# Patient Record
Sex: Male | Born: 1965 | Race: White | Hispanic: No | Marital: Married | Smoking: Never smoker
Health system: Southern US, Community
[De-identification: ages and names within clinical notes are randomized; demographics above are authoritative.]

## PROBLEM LIST (undated history)

## (undated) DIAGNOSIS — L570 Actinic keratosis: Secondary | ICD-10-CM

## (undated) DIAGNOSIS — J3089 Other allergic rhinitis: Secondary | ICD-10-CM

## (undated) HISTORY — DX: Actinic keratosis: L57.0

## (undated) HISTORY — DX: Other allergic rhinitis: J30.89

---

## 2004-07-06 HISTORY — PX: VASECTOMY: SHX75

## 2014-06-12 ENCOUNTER — Ambulatory Visit: Payer: Self-pay | Admitting: Family Medicine

## 2015-08-03 ENCOUNTER — Ambulatory Visit (INDEPENDENT_AMBULATORY_CARE_PROVIDER_SITE_OTHER): Payer: Federal, State, Local not specified - PPO | Admitting: Family Medicine

## 2015-08-03 ENCOUNTER — Encounter: Payer: Self-pay | Admitting: Family Medicine

## 2015-08-03 VITALS — BP 104/70 | HR 80 | Temp 97.7°F | Resp 16 | Ht 69.5 in | Wt 202.8 lb

## 2015-08-03 DIAGNOSIS — Z13220 Encounter for screening for lipoid disorders: Secondary | ICD-10-CM

## 2015-08-03 DIAGNOSIS — Z0000 Encounter for general adult medical examination without abnormal findings: Secondary | ICD-10-CM

## 2015-08-03 DIAGNOSIS — Z131 Encounter for screening for diabetes mellitus: Secondary | ICD-10-CM

## 2015-08-03 NOTE — Progress Notes (Signed)
Subjective:     Patient ID: Stephen Hubbard, male   DOB: 07/03/1965, 50 y.o.   MRN: [id number]  HPI  Chief Complaint  Patient presents with  . Annual Exam    Patient comes in office today for his annual physical, he states that he has no questions today or concerns. Patient reports that he has noticed visual changes over the past year.   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   Review of Systems General: Feeling well HEENT: regular dental visits. Wears otc reading glasses with last eye exam 1.5 years ago Cardiovascular: no chest pain, shortness of breath, or palpitations GI: rare heartburn, no change in bowel habits or blood in the stool GU: nocturia x 0- 1, no change in bladder habits  Psychiatric: not depressed Musculoskeletal: no joint pain    Objective:   Physical Exam  Constitutional: He appears well-developed and well-nourished. No distress.  Eyes: PERRLA Neck: no thyromegaly, tenderness or nodules,  ENT: TM's intact without inflammation; No tonsillar enlargement or exudate, Lungs: Clear Heart : RRR without murmur or gallop Abd: bowel sounds present, soft, non-tender, no organomegaly Extremities: no edema     Assessment:    1. Annual physical exam  2. Screening for cholesterol level - Lipid panel  3. Screening for diabetes mellitus - Comprehensive metabolic panel    Plan:    Further f/u pending lab work.

## 2015-08-03 NOTE — Patient Instructions (Signed)
We will call you with the lab result.

## 2015-08-07 LAB — COMPREHENSIVE METABOLIC PANEL WITH GFR
ALT: 17 IU/L (ref 0–44)
AST: 17 IU/L (ref 0–40)
Albumin/Globulin Ratio: 1.8 (ref 1.1–2.5)
Albumin: 4.2 g/dL (ref 3.5–5.5)
Alkaline Phosphatase: 69 IU/L (ref 39–117)
BUN/Creatinine Ratio: 15 (ref 9–20)
BUN: 13 mg/dL (ref 6–24)
Bilirubin Total: 1 mg/dL (ref 0.0–1.2)
CO2: 27 mmol/L (ref 18–29)
Calcium: 9.3 mg/dL (ref 8.7–10.2)
Chloride: 104 mmol/L (ref 96–106)
Creatinine, Ser: 0.85 mg/dL (ref 0.76–1.27)
GFR calc Af Amer: 119 mL/min/1.73
GFR calc non Af Amer: 103 mL/min/1.73
Globulin, Total: 2.4 g/dL (ref 1.5–4.5)
Glucose: 102 mg/dL — ABNORMAL HIGH (ref 65–99)
Potassium: 5.2 mmol/L (ref 3.5–5.2)
Sodium: 143 mmol/L (ref 134–144)
Total Protein: 6.6 g/dL (ref 6.0–8.5)

## 2015-08-07 LAB — LIPID PANEL
CHOL/HDL RATIO: 3.7 ratio (ref 0.0–5.0)
Cholesterol, Total: 151 mg/dL (ref 100–199)
HDL: 41 mg/dL (ref >39)
LDL CALC: 99 mg/dL (ref 0–99)
TRIGLYCERIDES: 54 mg/dL (ref 0–149)
VLDL CHOLESTEROL CAL: 11 mg/dL (ref 5–40)

## 2015-08-09 ENCOUNTER — Telehealth: Payer: Self-pay

## 2015-08-09 NOTE — Telephone Encounter (Signed)
Patient has been advised, KW 

## 2015-08-09 NOTE — Telephone Encounter (Signed)
-----   Message from Carmon Ginsberg, [place] sent at 08/09/2015  7:40 AM EST ----- Labs look good except for a very mild elevation of your sugar. Would check your labs annually.

## 2016-04-14 ENCOUNTER — Ambulatory Visit (INDEPENDENT_AMBULATORY_CARE_PROVIDER_SITE_OTHER): Payer: Federal, State, Local not specified - PPO | Admitting: Family Medicine

## 2016-04-14 ENCOUNTER — Encounter: Payer: Self-pay | Admitting: Family Medicine

## 2016-04-14 VITALS — BP 130/80 | HR 84 | Temp 97.9°F | Resp 16 | Wt 200.0 lb

## 2016-04-14 DIAGNOSIS — M26629 Arthralgia of temporomandibular joint, unspecified side: Secondary | ICD-10-CM

## 2016-04-14 MED ORDER — CYCLOBENZAPRINE HCL 5 MG PO TABS: 5.0000 mg | ORAL_TABLET | Freq: Every day | ORAL | 0 refills | Status: DC

## 2016-04-14 NOTE — Progress Notes (Signed)
Subjective:     Patient ID: Stephen Hubbard, male   DOB: [birth year], 50 y.o.   MRN: [id number]  HPI  Chief Complaint  Patient presents with  . Jaw Pain    x 1 week. Started with jaw "popping", and now pt can barely eat due to pain. Right side of jaw. Radiating into right ear. Exacerbated by chewing.  States he does not chew gum but does like to eat nuts and apples throughout his workday. Denies bruxism or prior joint problems. States he is up to date with his dentist.   Review of Systems     Objective:   Physical Exam  Constitutional: He appears well-developed and well-nourished. No distress.  HENT:  Right Ear: Tympanic membrane and ear canal normal.  No tooth tenderness on percussion. Right buccal area without inflammation or lesion.  Musculoskeletal:  Mild tenderness and crepitus to his left TMJ joint with movement.       Assessment:    1. TMJ syndrome - cyclobenzaprine (FLEXERIL) 5 MG tablet; Take 1 tablet (5 mg total) by mouth at bedtime.  Dispense: 14 tablet; Refill: 0    Plan:    Discussed switching to soft foods and scheduling ibuprofen 800 mg. 3 x day with food. Consider ENT evaluation if not improving.

## 2016-04-14 NOTE — Patient Instructions (Signed)
Discussed taking ibuprofen 800 mg. 3 x day with food. Avoid hard food. May wish to use soft foods for a few days. If not improving over a week call for ENT referral.

## 2016-08-03 ENCOUNTER — Ambulatory Visit (INDEPENDENT_AMBULATORY_CARE_PROVIDER_SITE_OTHER): Payer: Federal, State, Local not specified - PPO | Admitting: Family Medicine

## 2016-08-03 ENCOUNTER — Encounter: Payer: Self-pay | Admitting: Family Medicine

## 2016-08-03 VITALS — BP 122/84 | HR 86 | Temp 98.2°F | Resp 16 | Ht 68.5 in | Wt 194.4 lb

## 2016-08-03 DIAGNOSIS — Z23 Encounter for immunization: Secondary | ICD-10-CM | POA: Diagnosis not present

## 2016-08-03 DIAGNOSIS — Z0000 Encounter for general adult medical examination without abnormal findings: Secondary | ICD-10-CM | POA: Diagnosis not present

## 2016-08-03 NOTE — Patient Instructions (Signed)
We will call you with the lab work. 

## 2016-08-03 NOTE — Progress Notes (Signed)
Subjective:     Patient ID: Stephen Hubbard, male   DOB: [birth year], 51 y.o.   MRN: [id number]  HPI  Chief Complaint  Patient presents with  . Annual Exam    Patient comes in office today for his annual physical, he states that he is feeling well today. Patient states that he follows a well balanced diet and is staying active but not actively exercising, patient averages 6-7hrs of sleep a night. Patients last reported TD was 2004 he is due for Tdap vaccine, he has declined flu vaccine today.   States he has a dermatology visit pending for a small left temporal lesion. He is bald and reports significant sun exposure. Also is scheduling with podiatry for a knot on his foot. He continues to work for the [employer].   Review of Systems General: Feeling well HEENT: regular dental visits and recent eye exam with glasses prescribed. Cardiovascular: no chest pain, shortness of breath, or palpitations GI: no heartburn, no change in bowel habits. Rarely will find blood on toilet tissue if he has been straining or doing heavy work. GU: nocturia x 0, no change in bladder habits  Psychiatric: not depressed Musculoskeletal: no joint pain    Objective:   Physical Exam  Constitutional: He appears well-developed and well-nourished. No distress.  Eyes: PERRLA Neck: no thyromegaly, tenderness or nodules, no cervical adenopathy ENT: TM's intact without inflammation; No tonsillar enlargement or exudate, Lungs: Clear Heart : RRR without murmur or gallop Abd: bowel sounds present, soft, non-tender, no organomegaly Extremities: no edema, right foot: dorsal aspect of third metatarsal with 1 cm area of induration c/w ganglion cyst. Skin: 3-4 mm tan nevus on left temporal area.     Assessment:    1. Need for tetanus booster - Tdap vaccine greater than or equal to 7yo IM  2. Annual physical exam - Comprehensive metabolic panel - Lipid panel    Plan:    Further f/u pending labs and consultant  evaluations.

## 2016-08-08 DIAGNOSIS — Z0000 Encounter for general adult medical examination without abnormal findings: Secondary | ICD-10-CM | POA: Diagnosis not present

## 2016-08-09 ENCOUNTER — Telehealth: Payer: Self-pay

## 2016-08-09 LAB — LIPID PANEL
Chol/HDL Ratio: 3.4 ratio (ref 0.0–5.0)
Cholesterol, Total: 158 mg/dL (ref 100–199)
HDL: 46 mg/dL
LDL Calculated: 102 mg/dL — ABNORMAL HIGH (ref 0–99)
Triglycerides: 51 mg/dL (ref 0–149)
VLDL Cholesterol Cal: 10 mg/dL (ref 5–40)

## 2016-08-09 LAB — COMPREHENSIVE METABOLIC PANEL
ALBUMIN: 4.3 g/dL (ref 3.5–5.5)
ALK PHOS: 70 IU/L (ref 39–117)
ALT: 20 IU/L (ref 0–44)
AST: 21 IU/L (ref 0–40)
Albumin/Globulin Ratio: 1.7 (ref 1.2–2.2)
BUN / CREAT RATIO: 20 (ref 9–20)
BUN: 16 mg/dL (ref 6–24)
Bilirubin Total: 0.9 mg/dL (ref 0.0–1.2)
CHLORIDE: 100 mmol/L (ref 96–106)
CO2: 26 mmol/L (ref 18–29)
CREATININE: 0.81 mg/dL (ref 0.76–1.27)
Calcium: 9.2 mg/dL (ref 8.7–10.2)
GFR calc Af Amer: 121 mL/min/{1.73_m2} (ref >59)
GFR calc non Af Amer: 104 mL/min/{1.73_m2} (ref >59)
GLUCOSE: 96 mg/dL (ref 65–99)
Globulin, Total: 2.5 g/dL (ref 1.5–4.5)
Potassium: 4.5 mmol/L (ref 3.5–5.2)
Sodium: 141 mmol/L (ref 134–144)
TOTAL PROTEIN: 6.8 g/dL (ref 6.0–8.5)

## 2016-08-09 NOTE — Telephone Encounter (Signed)
-----   Message from Carmon Ginsberg, [place] sent at 08/09/2016  7:45 AM EST ----- Labs are ok. Minimally elevated cholesterol with a low calculated 10 year risk of developing cardiovascular disease of 2.2%.

## 2016-08-09 NOTE — Telephone Encounter (Signed)
Patient has been advised. KW 

## 2016-09-21 DIAGNOSIS — K080 Exfoliation of teeth due to systemic causes: Secondary | ICD-10-CM | POA: Diagnosis not present

## 2016-09-26 DIAGNOSIS — M7751 Other enthesopathy of right foot: Secondary | ICD-10-CM | POA: Diagnosis not present

## 2016-09-26 DIAGNOSIS — M79671 Pain in right foot: Secondary | ICD-10-CM | POA: Diagnosis not present

## 2016-11-01 DIAGNOSIS — M79671 Pain in right foot: Secondary | ICD-10-CM | POA: Diagnosis not present

## 2016-11-01 DIAGNOSIS — M7751 Other enthesopathy of right foot: Secondary | ICD-10-CM | POA: Diagnosis not present

## 2016-11-06 DIAGNOSIS — L821 Other seborrheic keratosis: Secondary | ICD-10-CM | POA: Diagnosis not present

## 2016-11-06 DIAGNOSIS — L812 Freckles: Secondary | ICD-10-CM | POA: Diagnosis not present

## 2016-11-06 DIAGNOSIS — D485 Neoplasm of uncertain behavior of skin: Secondary | ICD-10-CM | POA: Diagnosis not present

## 2016-11-06 DIAGNOSIS — L578 Other skin changes due to chronic exposure to nonionizing radiation: Secondary | ICD-10-CM | POA: Diagnosis not present

## 2016-11-06 DIAGNOSIS — D1800 Hemangioma unspecified site: Secondary | ICD-10-CM | POA: Diagnosis not present

## 2016-11-06 DIAGNOSIS — L820 Inflamed seborrheic keratosis: Secondary | ICD-10-CM | POA: Diagnosis not present

## 2017-01-25 ENCOUNTER — Encounter: Payer: Self-pay | Admitting: Physician Assistant

## 2017-01-25 ENCOUNTER — Ambulatory Visit (INDEPENDENT_AMBULATORY_CARE_PROVIDER_SITE_OTHER): Payer: Federal, State, Local not specified - PPO | Admitting: Physician Assistant

## 2017-01-25 VITALS — BP 116/78 | HR 84 | Temp 98.7°F | Resp 16 | Wt 187.0 lb

## 2017-01-25 DIAGNOSIS — G6289 Other specified polyneuropathies: Secondary | ICD-10-CM | POA: Diagnosis not present

## 2017-01-25 DIAGNOSIS — L299 Pruritus, unspecified: Secondary | ICD-10-CM | POA: Diagnosis not present

## 2017-01-25 DIAGNOSIS — R21 Rash and other nonspecific skin eruption: Secondary | ICD-10-CM | POA: Diagnosis not present

## 2017-01-25 LAB — POCT GLYCOSYLATED HEMOGLOBIN (HGB A1C): Hemoglobin A1C: 5.2

## 2017-01-25 MED ORDER — HYDROXYZINE HCL 25 MG PO TABS: 25.0000 mg | ORAL_TABLET | Freq: Three times a day (TID) | ORAL | 0 refills | Status: DC | PRN

## 2017-01-25 MED ORDER — TRIAMCINOLONE ACETONIDE 0.1 % EX CREA: 1.0000 "application " | TOPICAL_CREAM | Freq: Two times a day (BID) | CUTANEOUS | 0 refills | Status: DC

## 2017-01-25 NOTE — Patient Instructions (Signed)
Peripheral Neuropathy Peripheral neuropathy is a type of nerve damage. It affects nerves that carry signals between the spinal cord and other parts of the body. These are called peripheral nerves. With peripheral neuropathy, one nerve or a group of nerves may be damaged. What are the causes? Many things can damage peripheral nerves. For some people with peripheral neuropathy, the cause is unknown. Some causes include:  Diabetes. This is the most common cause of peripheral neuropathy.  Injury to a nerve.  Pressure or stress on a nerve that lasts a long time.  Too little vitamin B. Alcoholism can lead to this.  Infections.  Autoimmune diseases, such as multiple sclerosis and systemic lupus erythematosus.  Inherited nerve diseases.  Some medicines, such as cancer drugs.  Toxic substances, such as lead and mercury.  Too little blood flowing to the legs.  Kidney disease.  Thyroid disease.  What are the signs or symptoms? Different people have different symptoms. The symptoms you have will depend on which of your nerves is damaged. Common symptoms include:  Loss of feeling (numbness) in the feet and hands.  Tingling in the feet and hands.  Pain that burns.  Very sensitive skin.  Weakness.  Not being able to move a part of the body (paralysis).  Muscle twitching.  Clumsiness or poor coordination.  Loss of balance.  Not being able to control your bladder.  Feeling dizzy.  Sexual problems.  How is this diagnosed? Peripheral neuropathy is a symptom, not a disease. Finding the cause of peripheral neuropathy can be hard. To figure that out, your health care provider will take a medical history and do a physical exam. A neurological exam will also be done. This involves checking things affected by your brain, spinal cord, and nerves (nervous system). For example, your health care provider will check your reflexes, how you move, and what you can feel. Other types of tests  may also be ordered, such as:  Blood tests.  A test of the fluid in your spinal cord.  Imaging tests, such as CT scans or an MRI.  Electromyography (EMG). This test checks the nerves that control muscles.  Nerve conduction velocity tests. These tests check how fast messages pass through your nerves.  Nerve biopsy. A small piece of nerve is removed. It is then checked under a microscope.  How is this treated?  Medicine is often used to treat peripheral neuropathy. Medicines may include: ? Pain-relieving medicines. Prescription or over-the-counter medicine may be suggested. ? Antiseizure medicine. This may be used for pain. ? Antidepressants. These also may help ease pain from neuropathy. ? Lidocaine. This is a numbing medicine. You might wear a patch or be given a shot. ? Mexiletine. This medicine is typically used to help control irregular heart rhythms.  Surgery. Surgery may be needed to relieve pressure on a nerve or to destroy a nerve that is causing pain.  Physical therapy to help movement.  Assistive devices to help movement. Follow these instructions at home:  Only take over-the-counter or prescription medicines as directed by your health care provider. Follow the instructions carefully for any given medicines. Do not take any other medicines without first getting approval from your health care provider.  If you have diabetes, work closely with your health care provider to keep your blood sugar under control.  If you have numbness in your feet: ? Check every day for signs of injury or infection. Watch for redness, warmth, and swelling. ? Wear padded socks and comfortable   shoes. These help protect your feet.  Do not do things that put pressure on your damaged nerve.  Do not smoke. Smoking keeps blood from getting to damaged nerves.  Avoid or limit alcohol. Too much alcohol can cause a lack of B vitamins. These vitamins are needed for healthy nerves.  Develop a good  support system. Coping with peripheral neuropathy can be stressful. Talk to a mental health specialist or join a support group if you are struggling.  Follow up with your health care provider as directed. Contact a health care provider if:  You have new signs or symptoms of peripheral neuropathy.  You are struggling emotionally from dealing with peripheral neuropathy.  You have a fever. Get help right away if:  You have an injury or infection that is not healing.  You feel very dizzy or begin vomiting.  You have chest pain.  You have trouble breathing. This information is not intended to replace advice given to you by your health care provider. Make sure you discuss any questions you have with your health care provider. Document Released: 05/12/2002 Document Revised: 10/28/2015 Document Reviewed: 01/27/2013 Elsevier Interactive Patient Education  2017 Elsevier Inc.  

## 2017-01-25 NOTE — Progress Notes (Signed)
Patient: Stephen Hubbard Male    DOB: 10/29/1965   51 y.o.   MRN: [id number] Visit Date: 01/25/2017  Today's Provider: Trinna Post, PA-C   Chief Complaint  Patient presents with  . Rash    Comes and goes for the last six weeks.   Subjective:      Stephen Hubbard is a 51 y/o who presents with both itching and a rash ongoing for six weeks. The itching and rash are unrelated. First he has a rash on his back that comes and goes, especially when he was working on cars and laying on gravel. No tick exposure that he remembers.   Second, he has episodes of intense burning, itching in his hands and his feet, mostly at night, but no rash ever appears here. He says it happens at night. He has changed his detergent to unscented, gentle detergents. Has noticed no difference from this. No new lotions or scents. Does not use alcohol. Fatigued, but no history of thyroid dysfunction. No history of anemia. No problems with tongue being red and burning.   He does mention that he has been undergoing stress recently as his daughter has moved back home after being homeless and addicted to drugs, she is also pregnant.   Rash  This is a recurrent problem. The current episode started more than 1 month ago. The problem has been waxing and waning since onset. The rash is diffuse (Comes and goes in different spots. ). The rash is characterized by itchiness. He was exposed to nothing. Associated symptoms include fatigue. Pertinent negatives include no fever.    No Known Allergies   Current Outpatient Prescriptions:  Marland Kitchen  Multiple Vitamin (MULTIVITAMIN) tablet, Take 1 tablet by mouth daily., Disp: , Rfl:   Review of Systems  Constitutional: Positive for fatigue. Negative for activity change, appetite change, chills, diaphoresis, fever and unexpected weight change.  Gastrointestinal: Negative.   Skin: Positive for rash. Negative for color change, pallor and wound.       Pt also reports "Itching" hands,  feet, and arms.   Neurological: Negative for dizziness, light-headedness and headaches.    Social History  Substance Use Topics  . Smoking status: Never Smoker  . Smokeless tobacco: Never Used  . Alcohol use No   Objective:   BP 116/78 (BP Location: Left Arm, Patient Position: Sitting, Cuff Size: Normal)   Pulse 84   Temp 98.7 F (37.1 C) (Oral)   Resp 16   Wt 187 lb (84.8 kg)   BMI 28.02 kg/m  Vitals:   01/25/17 0825  BP: 116/78  Pulse: 84  Resp: 16  Temp: 98.7 F (37.1 C)  TempSrc: Oral  Weight: 187 lb (84.8 kg)     Physical Exam  Constitutional: He is oriented to person, place, and time. He appears well-developed and well-nourished.  Musculoskeletal: Normal range of motion.  Neurological: He is alert and oriented to person, place, and time. He has normal strength. No sensory deficit.  Skin: Skin is warm and dry. Rash noted.  Some faint erythematous papules on right side of back along T4-T8.  Psychiatric: He has a normal mood and affect. His behavior is normal.        Assessment & Plan:     1. Other polyneuropathy  A1C today is 5.2%. Will check for anemia and TSH disorders. Liver and kidney function normal from 08/2016. Exam benign.   - POCT HgB A1C - CBC With Differential - B12 -  TSH  2. Itching  - hydrOXYzine (ATARAX/VISTARIL) 25 MG tablet; Take 1 tablet (25 mg total) by mouth 3 (three) times daily as needed for itching.  Dispense: 30 tablet; Refill: 0  3. Rash  Think this rash is unrelated to itching and more of a contact dermatitis.   - triamcinolone cream (KENALOG) 0.1 %; Apply 1 application topically 2 (two) times daily.  Dispense: 30 g; Refill: 0  No Follow-up on file.  The entirety of the information documented in the History of Present Illness, Review of Systems and Physical Exam were personally obtained by me. Portions of this information were initially documented by Ashley Royalty, CMA and reviewed by me for thoroughness and accuracy.           Trinna Post, PA-C  [facility]

## 2017-01-26 LAB — CBC WITH DIFFERENTIAL
Basophils Absolute: 0 10*3/uL (ref 0.0–0.2)
Basos: 1 %
EOS (ABSOLUTE): 0.1 10*3/uL (ref 0.0–0.4)
Eos: 2 %
Hematocrit: 47.4 % (ref 37.5–51.0)
Hemoglobin: 16.3 g/dL (ref 13.0–17.7)
Immature Grans (Abs): 0 10*3/uL (ref 0.0–0.1)
Immature Granulocytes: 0 %
Lymphocytes Absolute: 2.1 10*3/uL (ref 0.7–3.1)
Lymphs: 37 %
MCH: 30.7 pg (ref 26.6–33.0)
MCHC: 34.4 g/dL (ref 31.5–35.7)
MCV: 89 fL (ref 79–97)
Monocytes Absolute: 0.5 10*3/uL (ref 0.1–0.9)
Monocytes: 10 %
Neutrophils Absolute: 2.9 10*3/uL (ref 1.4–7.0)
Neutrophils: 50 %
RBC: 5.31 x10E6/uL (ref 4.14–5.80)
RDW: 12.9 % (ref 12.3–15.4)
WBC: 5.7 10*3/uL (ref 3.4–10.8)

## 2017-01-26 LAB — VITAMIN B12: Vitamin B-12: 451 pg/mL (ref 232–1245)

## 2017-01-26 LAB — TSH: TSH: 1.18 u[IU]/mL (ref 0.450–4.500)

## 2017-06-06 ENCOUNTER — Encounter: Payer: Self-pay | Admitting: Family Medicine

## 2017-06-06 ENCOUNTER — Other Ambulatory Visit: Payer: Self-pay

## 2017-06-06 ENCOUNTER — Ambulatory Visit: Payer: Federal, State, Local not specified - PPO | Admitting: Family Medicine

## 2017-06-06 VITALS — BP 112/84 | HR 82 | Temp 97.7°F | Resp 16 | Wt 191.0 lb

## 2017-06-06 DIAGNOSIS — L299 Pruritus, unspecified: Secondary | ICD-10-CM

## 2017-06-06 MED ORDER — HYDROXYZINE HCL 25 MG PO TABS: 25.0000 mg | ORAL_TABLET | Freq: Three times a day (TID) | ORAL | 1 refills | Status: DC | PRN

## 2017-06-06 NOTE — Progress Notes (Signed)
Patient: Stephen Hubbard Male    DOB: [birth year]   52 y.o.   MRN: [id number] Visit Date: 06/06/2017  Today's Provider: Lavon Paganini, MD   Chief Complaint  Patient presents with  . Anxiety   I, Emily Ratchford, CMA, am acting as scribe for Lavon Paganini, MD.  Subjective:    Anxiety  Presents for initial visit. Episode onset: since December 6th, when grandchild was born. The problem has been gradually worsening. Symptoms include insomnia and nervous/anxious behavior. Patient reports no chest pain, confusion, decreased concentration, depressed mood, dizziness, dry mouth, excessive worry, feeling of choking, hyperventilation, irritability, malaise, palpitations, panic, restlessness or suicidal ideas. Primary symptoms comment: "I'm wore out". The symptoms are aggravated by family issues (states he is struggling with his grandchild, who is 72.78 weeks old. His grandchild's father is causing trouble in the family.  He states social services has been involved.).    Pt is mainly concerned about pruritis. Denies rash. States the itching could be caused by stress. Has tried Aveeno, without relief. Triamcinolone cream is also ineffective. He has tried Hydroxyzine, with some relief (was better when taking 25mg  dose, but was cutting them trying to not run out because he didn't have refills).  Has been seen previously in our clinic and thought to have hives and treated with Hydroxyzine.  He states that he never itches while at work and focused on other things.  States that he is worse when relaxing in the evenings and mind wanders.  Denies changes in soaps, detergents, meds, etc.     GAD 7 : Generalized Anxiety Score 06/06/2017  Nervous, Anxious, on Edge 0  Control/stop worrying 0  Worry too much - different things 1  Trouble relaxing 1  Restless 1  Easily annoyed or irritable 0  Afraid - awful might happen 2  Total GAD 7 Score 5  Anxiety Difficulty Not difficult at all      No Known  Allergies   Current Outpatient Medications:  Marland Kitchen  Multiple Vitamin (MULTIVITAMIN) tablet, Take 1 tablet by mouth daily., Disp: , Rfl:  .  hydrOXYzine (ATARAX/VISTARIL) 25 MG tablet, Take 1 tablet (25 mg total) by mouth 3 (three) times daily as needed for itching. (Patient not taking: Reported on 06/06/2017), Disp: 30 tablet, Rfl: 0 .  triamcinolone cream (KENALOG) 0.1 %, Apply 1 application topically 2 (two) times daily. (Patient not taking: Reported on 06/06/2017), Disp: 30 g, Rfl: 0  Review of Systems  Constitutional: Negative for irritability.  Cardiovascular: Negative for chest pain and palpitations.  Neurological: Negative for dizziness.  Psychiatric/Behavioral: Negative for confusion, decreased concentration and suicidal ideas. The patient is nervous/anxious and has insomnia.     Social History   Tobacco Use  . Smoking status: Never Smoker  . Smokeless tobacco: Never Used  Substance Use Topics  . Alcohol use: No    Alcohol/week: 0.0 oz   Objective:   BP 112/84 (BP Location: Left Arm, Patient Position: Sitting, Cuff Size: Normal)   Pulse 82   Temp 97.7 F (36.5 C) (Oral)   Resp 16   Wt 191 lb (86.6 kg)   SpO2 95%   BMI 28.62 kg/m  Vitals:   06/06/17 1542  BP: 112/84  Pulse: 82  Resp: 16  Temp: 97.7 F (36.5 C)  TempSrc: Oral  SpO2: 95%  Weight: 191 lb (86.6 kg)     Physical Exam  Constitutional: He is oriented to person, place, and time. He appears well-developed and well-nourished.  No distress.  HENT:  Head: Normocephalic and atraumatic.  Eyes: Conjunctivae are normal. No scleral icterus.  Cardiovascular: Normal rate, regular rhythm, normal heart sounds and intact distal pulses.  No murmur heard. Pulmonary/Chest: Effort normal and breath sounds normal. No respiratory distress. He has no wheezes. He has no rales.  Musculoskeletal: He exhibits no edema.  Neurological: He is alert and oriented to person, place, and time. No cranial nerve deficit.  Skin: Skin is  warm and dry. No rash noted.  Few excoriations on b/l hands  Psychiatric: He has a normal mood and affect. His behavior is normal.  Vitals reviewed.       Assessment & Plan:     1. 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  Dispense: 90 tablet; Refill: 1 - return prn - precautions discussed     The entirety of the information documented in the History of Present Illness, Review of Systems and Physical Exam were personally obtained by me. Portions of this information were initially documented by Raquel Sarna Ratchford, CMA and reviewed by me for thoroughness and accuracy.    Virginia Crews, MD, MPH [facility] 06/06/2017 4:24 PM

## 2017-06-06 NOTE — Patient Instructions (Signed)
Neurodermatitis Neurodermatitis is inflammation and thickening of the skin. It is caused by severe itchiness, which leads to repeated scratching and rubbing of the skin. It can happen anywhere on the body. Common places include the neck, head, arms, and legs. Neurodermatitis may have mental or emotional (psychogenic) causes that must be treated. Usually, this is a lifelong (chronic) condition, but in some cases it may go away on its own or with treatment. What are the causes? This condition is caused by repeated scratching and rubbing of the skin. This happens because of feelings of severe itchiness. Often, the cause of itchiness is not known. Common causes include:  Materials or substances that irritate the skin.  Skin conditions, such as chronic dermatitis or eczema.  Allergic reaction.  In some cases, neurodermatitis may have psychogenic causes, such as anxiety or stress. What increases the risk? This condition is more likely to develop in:  People who have dry skin or other skin conditions.  People who have anxiety disorders or high amounts of stress.  People who are 20-50 years of age.  Women.  People who are exposed to irritating chemicals.  People who spend time in hot places or sweat a lot.  What are the signs or symptoms? The main symptom of this condition is one or more patches of skin that are red, swollen, itchy, and thicker than normal skin. These patches can be anywhere on the body, but they are often found on the head, neck, legs, and arms. Neurodermatitis can also affect the genital and anal areas. In severe cases, bleeding, crusting, and scaling of the skin can occur. Symptoms often come and go over time. The frequency and severity of symptoms varies from person to person. How is this diagnosed? This condition is diagnosed based on your medical history and a physical exam of your skin. You may be referred to a health care provider who specializes in skin care  (dermatologist). You may also have tests, including:  Skin allergy tests. These tests will determine if you have an allergy that is causing your condition.  Tests for certain infections, hemorrhoids, psoriasis, or other conditions. These tests may be done if your groin or anal area is inflamed.  In some cases, your health care provider may remove a small amount of skin cells to be examined under a microscope (biopsy). How is this treated? This condition is managed by stopping all scratching and rubbing of the affected area. It is also important to remove all skin irritants and treat any underlying causes of neurodermatitis. Depending on the cause, your health care provider may recommend certain medicines, such as:  Creams, lotions, or pills to reduce inflammation and itching (corticosteroids).  Medicines to prevent or treat infection (antibiotics).  Medicines to relieve allergy symptoms (antihistamines).  Therapy to learn how to stop feelings of itchiness and stop scratching (behavioral therapy or psychotherapy) may be a treatment option. Your health care provider may recommend a doctor who specializes in human behavior (psychologist). Follow these instructions at home: Skin Care  Avoid scratching and rubbing your skin. This is the best way to manage neurodermatitis and prevent it from returning.  Keep your skin clean and moisturized. ? Avoid very hot water. ? Apply lotion at least one time per day. ? Avoid products, such as soaps and lotions, that have harsh chemicals, scents, and dyes. ? Try to shower and take baths only as often as you need to. Frequent bathing can dry out your skin.  Drink enough fluid to   keep your urine clear or pale yellow.  Always wear sunscreen. General instructions  Use over-the-counter and prescription medicines only as told by your health care provider.  Avoid tight or rough clothing that irritates your skin.  Avoid irritating chemicals.  Pay  attention to your symptoms. Watch for things that trigger itching and scratching.  Keep all follow-up visits as told by your health care provider. This is important. Contact a health care provider if:  Your condition gets worse or does not get better after 3-4 days of treatment.  You have blood or fluid leaking from an irritated patch of skin.  You have a fever. This information is not intended to replace advice given to you by your health care provider. Make sure you discuss any questions you have with your health care provider. Document Released: 06/29/2004 Document Revised: 10/28/2015 Document Reviewed: 10/07/2014 Elsevier Interactive Patient Education  2018 [employer] Inc.  

## 2017-06-12 DIAGNOSIS — M2021 Hallux rigidus, right foot: Secondary | ICD-10-CM | POA: Diagnosis not present

## 2017-06-12 DIAGNOSIS — G5761 Lesion of plantar nerve, right lower limb: Secondary | ICD-10-CM | POA: Diagnosis not present

## 2017-06-12 DIAGNOSIS — M79671 Pain in right foot: Secondary | ICD-10-CM | POA: Diagnosis not present

## 2017-07-23 ENCOUNTER — Encounter: Payer: Self-pay | Admitting: Family Medicine

## 2017-07-23 ENCOUNTER — Ambulatory Visit: Payer: Federal, State, Local not specified - PPO | Admitting: Family Medicine

## 2017-07-23 VITALS — BP 110/80 | HR 99 | Temp 100.2°F | Wt 200.0 lb

## 2017-07-23 DIAGNOSIS — R509 Fever, unspecified: Secondary | ICD-10-CM | POA: Diagnosis not present

## 2017-07-23 DIAGNOSIS — J101 Influenza due to other identified influenza virus with other respiratory manifestations: Secondary | ICD-10-CM | POA: Diagnosis not present

## 2017-07-23 DIAGNOSIS — R52 Pain, unspecified: Secondary | ICD-10-CM | POA: Diagnosis not present

## 2017-07-23 LAB — POCT INFLUENZA A/B
Influenza A, POC: POSITIVE — AB
Influenza B, POC: NEGATIVE

## 2017-07-23 MED ORDER — OSELTAMIVIR PHOSPHATE 75 MG PO CAPS: 75.0000 mg | ORAL_CAPSULE | Freq: Two times a day (BID) | ORAL | 0 refills | Status: DC

## 2017-07-23 NOTE — Progress Notes (Signed)
      Patient: Stephen Hubbard Male    DOB: 02-Mar-1966   52 y.o.   MRN: [id number] Visit Date: 07/23/2017  Today's Provider: Vernie Murders, PA   Chief Complaint  Patient presents with  . Sore Throat  . Fever   Subjective:    URI   This is a new problem. Episode onset: Saturday. The problem has been waxing and waning. The maximum temperature recorded prior to his arrival was 100.4 - 100.9 F. The fever has been present for 1 to 2 days. Associated symptoms include a sore throat. Treatments tried: throat drops, gargle salt water. The treatment provided mild relief.   Past Medical History:  Diagnosis Date  . 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 VASECTOMY  07/2004   Family History  Problem Relation Age of Onset  . Cancer Mother        breast  . Drug abuse Father   . Heart attack Father   . Depression Brother    No Known Allergies  Current Outpatient Medications:  .  hydrOXYzine (ATARAX/VISTARIL) 25 MG tablet, Take 1-2 tablets (25-50 mg total) by mouth every 8 (eight) hours as needed for itching., Disp: 90 tablet, Rfl: 1 .  Multiple Vitamin (MULTIVITAMIN) tablet, Take 1 tablet by mouth daily., Disp: , Rfl:   Review of Systems  Constitutional: Positive for fever.  HENT: Positive for sore throat.   Respiratory: Negative.   Cardiovascular: Negative.   Musculoskeletal: Positive for myalgias.   Social History   Tobacco Use  . Smoking status: Never Smoker  . Smokeless tobacco: Never Used  Substance Use Topics  . Alcohol use: No    Alcohol/week: 0.0 oz   Objective:   BP 110/80 (BP Location: Right Arm, Patient Position: Sitting, Cuff Size: Normal)   Pulse 99   Temp 100.2 F (37.9 C) (Oral)   Wt 200 lb (90.7 kg)   SpO2 98%   BMI 29.97 kg/m   Physical Exam  Constitutional: He is oriented to person, place, and time. He appears well-developed and well-nourished. No distress.  HENT:  Head: Normocephalic and atraumatic.    Right Ear: Hearing normal.  Left Ear: Hearing normal.  Nose: Nose normal.  Eyes: Conjunctivae and lids are normal. Right eye exhibits no discharge. Left eye exhibits no discharge. No scleral icterus.  Neck: Neck supple.  Cardiovascular: Normal rate and regular rhythm.  Pulmonary/Chest: Effort normal and breath sounds normal. No respiratory distress.  Abdominal: Soft. Bowel sounds are normal.  Musculoskeletal: Normal range of motion.  Neurological: He is alert and oriented to person, place, and time.  Skin: Skin is intact. No lesion and no rash noted.  Psychiatric: He has a normal mood and affect. His speech is normal and behavior is normal. Thought content normal.       Assessment & Plan:     1. Influenza A Onset 3 days ago. Having sore throat with fever and mild headache. Using Tylenol and saltwater gargles prn. Increase fluid intake and treat with Tamiflu. Recheck prn. - oseltamivir (TAMIFLU) 75 MG capsule; Take 1 capsule (75 mg total) by mouth 2 (two) times daily.  Dispense: 10 capsule; Refill: [place], PA  [facility]

## 2017-07-23 NOTE — Patient Instructions (Signed)

## 2017-07-23 NOTE — Addendum Note (Signed)
Addended by: Jules Schick on: 07/23/2017 09:29 AM   Modules accepted: Orders

## 2017-08-15 ENCOUNTER — Ambulatory Visit: Payer: Federal, State, Local not specified - PPO | Admitting: Family Medicine

## 2017-08-15 ENCOUNTER — Encounter: Payer: Self-pay | Admitting: Family Medicine

## 2017-08-15 VITALS — BP 106/70 | HR 88 | Temp 97.8°F | Resp 16

## 2017-08-15 DIAGNOSIS — K529 Noninfective gastroenteritis and colitis, unspecified: Secondary | ICD-10-CM

## 2017-08-15 NOTE — Progress Notes (Signed)
Subjective:     Patient ID: Stephen Hubbard, male   DOB: Feb 06, 1966, 52 y.o.   MRN: [id number] Chief Complaint  Patient presents with  . Diarrhea    Started Sunday evening with abdominal pain, vomit (once) then diarrhea started Monday afternoon.     HPI Reports frequent watery stools which have started abating with time and imodium: "I was able to sleep last night." Symptoms started after eating at a restaurant. No other family members are ill.  Review of Systems     Objective:   Physical Exam  Constitutional: He appears well-developed and well-nourished. No distress.  Abdominal: Bowel sounds are normal. There is no tenderness. There is no guarding.       Assessment:    1. Gastroenteritis, acute: spontaneously resolving     Plan:    Continue imodium and eat as tolerated. Work excuse for 3/12-3/14/19.

## 2017-08-15 NOTE — Patient Instructions (Signed)
Continue imodium and start to eat as tolerated. Continue with Gatorade.

## 2017-12-27 DIAGNOSIS — K080 Exfoliation of teeth due to systemic causes: Secondary | ICD-10-CM | POA: Diagnosis not present

## 2018-02-21 ENCOUNTER — Encounter: Payer: Self-pay | Admitting: Family Medicine

## 2018-02-21 ENCOUNTER — Ambulatory Visit: Payer: Federal, State, Local not specified - PPO | Admitting: Family Medicine

## 2018-02-21 VITALS — BP 114/80 | HR 66 | Temp 97.9°F | Resp 16 | Wt 194.0 lb

## 2018-02-21 DIAGNOSIS — K219 Gastro-esophageal reflux disease without esophagitis: Secondary | ICD-10-CM

## 2018-02-21 DIAGNOSIS — L299 Pruritus, unspecified: Secondary | ICD-10-CM

## 2018-02-21 MED ORDER — HYDROXYZINE HCL 25 MG PO TABS: ORAL_TABLET | ORAL | 1 refills | Status: AC

## 2018-02-21 NOTE — Patient Instructions (Signed)
Decrease caffeine use at night. Elevate the head of your bed on 6 inch blocks. Let me know if your acid reflux is not improving.

## 2018-02-21 NOTE — Progress Notes (Signed)
  Subjective:     Patient ID: Stephen Hubbard, male   DOB: 11/29/1965, 52 y.o.   MRN: [id number] Chief Complaint  Patient presents with  . Pruritis    Patient comes in office today with concerns of itching for the past several months. Patient states that he has been itching on his hands, feet and upper torso. Patient reports that he was previously put on hydroxyzine and it has helped some with symptoms. Patient states that he has been under more stress as of recent working 65+hrs a week.  . Gastroesophageal Reflux    Patient reports for the past month regurgiating at night when laying down. Patient states that when he lays on his stomach or side he regurgitates.    HPI States his itching improves with the use of one hydroxyzine daily. Estimates he uses this 3 x week. 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 States he consumes 4-6+ cups of coffee daily-4 in the evening. He will get withdrawal headaches if he misses the coffee. States he does not use a pillow in bed. Discussed caffeine may be contributing to reflux.  Review of Systems     Objective:   Physical Exam  Constitutional: He appears well-developed and well-nourished. No distress.  Abdominal: Soft. There is no tenderness.       Assessment:    1. Gastroesophageal reflux disease without esophagitis: HOB elevation and slowly decrease nighttime caffeine use.  2. Itching - hydrOXYzine (ATARAX/VISTARIL) 25 MG tablet; Take one tablet 3 x day as needed for itching  Dispense: 90 tablet; Refill: 1    Plan:    Further f/u if not improving. Defers flu vaccine today. Will schedule for a physical in the near future.

## 2018-04-19 ENCOUNTER — Encounter: Payer: Self-pay | Admitting: Family Medicine

## 2018-04-22 ENCOUNTER — Encounter: Payer: Self-pay | Admitting: Family Medicine

## 2018-04-22 ENCOUNTER — Ambulatory Visit: Payer: Federal, State, Local not specified - PPO | Admitting: Family Medicine

## 2018-04-22 ENCOUNTER — Ambulatory Visit: Payer: Self-pay | Admitting: Family Medicine

## 2018-04-22 VITALS — BP 116/84 | HR 88 | Temp 97.8°F | Resp 16 | Wt 198.4 lb

## 2018-04-22 DIAGNOSIS — F321 Major depressive disorder, single episode, moderate: Secondary | ICD-10-CM | POA: Diagnosis not present

## 2018-04-22 DIAGNOSIS — F439 Reaction to severe stress, unspecified: Secondary | ICD-10-CM | POA: Diagnosis not present

## 2018-04-22 MED ORDER — SERTRALINE HCL 50 MG PO TABS: 50.0000 mg | ORAL_TABLET | Freq: Every day | ORAL | 3 refills | Status: DC

## 2018-04-22 MED ORDER — NORTRIPTYLINE HCL 25 MG PO CAPS: ORAL_CAPSULE | ORAL | 0 refills | Status: DC

## 2018-04-22 NOTE — Progress Notes (Addendum)
  Subjective:     Patient ID: Stephen Hubbard, male   DOB: 11/13/1965, 52 y.o.   MRN: [id number] Chief Complaint  Patient presents with  . Stress    Patient comes in office today to address situational stress. Patient has been under significant stress between work and home. Patient reports difficulty sleeping and changes in appetite.    HPI Reports he continues to care for his grandson as his daughter is using drugs. 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 He has gone to [facility] and will continue with them. Counselor recommended he come here for further treatment and time off from work. Denis use of alcohol or suicidal ideation. "I am stressed and depressed." He has decreased his caffeine consumption to 2 coffee and one soft drink daily. Review of Systems     Objective:   Physical Exam  Constitutional: He appears well-developed and well-nourished.  Psychiatric:  Stressed affect with near crying spells       Assessment:    1. Situational stress: start sertraline  2. Depression, major, single episode, moderate ([place]): start sertraline and nortriptyline for sleep.    Plan:    Work excuse from 11/18-12/2/19. Instructed to call if not tolerating the medication. Continue EAP.

## 2018-04-22 NOTE — Patient Instructions (Signed)
Continue employment assistance counseling. Let me know before your appointment if you can't tolerate the medication.

## 2018-04-23 ENCOUNTER — Telehealth: Payer: Self-pay | Admitting: Family Medicine

## 2018-04-23 ENCOUNTER — Encounter: Payer: Self-pay | Admitting: Family Medicine

## 2018-04-23 NOTE — Telephone Encounter (Signed)
Pt requesting a new work note without the diagnosis on the work note.  States he prefers it not be on there due to work place gossip.

## 2018-04-23 NOTE — Telephone Encounter (Signed)
Please review. KW 

## 2018-04-23 NOTE — Telephone Encounter (Signed)
Patient advised.KW 

## 2018-04-23 NOTE — Telephone Encounter (Signed)
Let him now the revised work excuse is up front for pickup.

## 2018-05-06 ENCOUNTER — Encounter: Payer: Self-pay | Admitting: Family Medicine

## 2018-05-06 ENCOUNTER — Other Ambulatory Visit: Payer: Self-pay

## 2018-05-06 ENCOUNTER — Ambulatory Visit: Payer: Federal, State, Local not specified - PPO | Admitting: Family Medicine

## 2018-05-06 VITALS — BP 110/90 | HR 115 | Temp 97.6°F | Ht 69.0 in | Wt 195.6 lb

## 2018-05-06 DIAGNOSIS — F439 Reaction to severe stress, unspecified: Secondary | ICD-10-CM | POA: Diagnosis not present

## 2018-05-06 DIAGNOSIS — F321 Major depressive disorder, single episode, moderate: Secondary | ICD-10-CM | POA: Diagnosis not present

## 2018-05-06 DIAGNOSIS — Z23 Encounter for immunization: Secondary | ICD-10-CM | POA: Diagnosis not present

## 2018-05-06 NOTE — Patient Instructions (Addendum)
Continue with counseling. Continue medication at current dose. I will work on your FMLA over the next day and will contact you when completed.

## 2018-05-06 NOTE — Progress Notes (Signed)
  Subjective:     Patient ID: Stephen Hubbard, male   DOB: [birth year], 52 y.o.   MRN: [id number] Chief Complaint  Patient presents with  . Depression    and stress follow up   HPI States he has accsessed both counseling through work and [employer]. Reports compliance with sertraline and has used nortriptyline to sleep as needed. States he has had less crying spells but has noticed minor side effects from medication (sweats/dry mouth).  Review of Systems     Objective:   Physical Exam  Constitutional: He appears well-developed and well-nourished. No distress.  Psychiatric: He has a normal mood and affect.       Assessment:    1. Situational stress  2. Depression, major, single episode, moderate ([place])  3. Need for influenza vaccine: administered Plan:    Continue medication. Work excuse for 12/2-12/16/19 provided. Has provided FMLA for completion.

## 2018-05-13 ENCOUNTER — Ambulatory Visit (INDEPENDENT_AMBULATORY_CARE_PROVIDER_SITE_OTHER): Payer: Federal, State, Local not specified - PPO | Admitting: Family Medicine

## 2018-05-13 ENCOUNTER — Other Ambulatory Visit: Payer: Self-pay

## 2018-05-13 ENCOUNTER — Encounter: Payer: Self-pay | Admitting: Family Medicine

## 2018-05-13 VITALS — BP 118/86 | HR 76 | Temp 97.9°F | Ht 69.0 in | Wt 196.2 lb

## 2018-05-13 DIAGNOSIS — M7021 Olecranon bursitis, right elbow: Secondary | ICD-10-CM

## 2018-05-13 NOTE — Patient Instructions (Signed)
We will call you about the orthopedic referral.

## 2018-05-13 NOTE — Progress Notes (Signed)
  Subjective:     Patient ID: Stephen Hubbard, male   DOB: [birth year], 52 y.o.   MRN: [id number] Chief Complaint  Patient presents with  . 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 Currently off work but seems to irritate it just putting it on the arm of a chair.  Review of Systems     Objective:   Physical Exam  Constitutional: He appears well-developed and well-nourished. No distress.  Musculoskeletal:  Mid swelling of his right olecranon c/w bursitis. No overlying erythema or significant tenderness on palpation.       Assessment:    1. Olecranon bursitis of right elbow - Ambulatory referral to Orthopedic Surgery    Plan:    Orthopedic evaluation

## 2018-05-17 ENCOUNTER — Other Ambulatory Visit: Payer: Self-pay | Admitting: Family Medicine

## 2018-05-20 ENCOUNTER — Encounter: Payer: Self-pay | Admitting: Family Medicine

## 2018-05-20 ENCOUNTER — Ambulatory Visit: Payer: Federal, State, Local not specified - PPO | Admitting: Family Medicine

## 2018-05-20 VITALS — BP 114/80 | HR 86 | Temp 97.9°F | Resp 16 | Wt 194.6 lb

## 2018-05-20 DIAGNOSIS — F321 Major depressive disorder, single episode, moderate: Secondary | ICD-10-CM

## 2018-05-20 DIAGNOSIS — J069 Acute upper respiratory infection, unspecified: Secondary | ICD-10-CM | POA: Diagnosis not present

## 2018-05-20 NOTE — Patient Instructions (Signed)
Discussed use of Delsym for cough and Claritin or similar for postnasal drainage.

## 2018-05-20 NOTE — Progress Notes (Signed)
  Subjective:     Patient ID: Stephen Hubbard, male   DOB: 1966-02-04, 52 y.o.   MRN: [id number] Chief Complaint  Patient presents with  . Depression    Patient returns to office today for 2 week follow up from 05/06/18. Patient reports good compliance and symptom control on medication. Patient states that he has noticed more sweating in the AM and increased appetitie. Patient states that he is still working on cutting out caffeine.    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 Continues to work with the [employer] counselor. Has not needed nortriptyline to help sleep but will continue on the sertraline. Reports mild cold sx.  Review of Systems     Objective:   Physical Exam Constitutional:      General: He is not in acute distress.    Appearance: He is not ill-appearing.  Psychiatric:        Mood and Affect: Mood normal.        Behavior: Behavior normal.   Ears: T.M's intact without inflammation Throat: no tonsillar enlargement or exudate Neck: no cervical adenopathy Lungs: clear     Assessment:    1. Depression, major, single episode, moderate ([place]): continue sertraline  2. URI, acute    Plan:    Discussed use of Delsym and loratadine for PND.

## 2018-05-23 DIAGNOSIS — M7021 Olecranon bursitis, right elbow: Secondary | ICD-10-CM | POA: Diagnosis not present

## 2018-05-23 DIAGNOSIS — M25521 Pain in right elbow: Secondary | ICD-10-CM | POA: Diagnosis not present

## 2018-06-20 ENCOUNTER — Encounter: Payer: Self-pay | Admitting: Family Medicine

## 2018-06-20 ENCOUNTER — Ambulatory Visit: Payer: Federal, State, Local not specified - PPO | Admitting: Family Medicine

## 2018-06-20 ENCOUNTER — Other Ambulatory Visit: Payer: Self-pay

## 2018-06-20 VITALS — BP 124/90 | HR 98 | Temp 97.5°F | Ht 70.0 in | Wt 196.6 lb

## 2018-06-20 DIAGNOSIS — F439 Reaction to severe stress, unspecified: Secondary | ICD-10-CM | POA: Diagnosis not present

## 2018-06-20 DIAGNOSIS — F321 Major depressive disorder, single episode, moderate: Secondary | ICD-10-CM | POA: Diagnosis not present

## 2018-06-20 MED ORDER — SERTRALINE HCL 100 MG PO TABS: 100.0000 mg | ORAL_TABLET | Freq: Every day | ORAL | 3 refills | Status: DC

## 2018-06-20 NOTE — Patient Instructions (Signed)
Let me know if you can't tolerate the higher dose.

## 2018-06-20 NOTE — Progress Notes (Signed)
  Subjective:     Patient ID: Stephen Hubbard, male   DOB: [birth year], 53 y.o.   MRN: [id number] Chief Complaint  Patient presents with  . Follow-up    1 month.  pt reports he is doing better   HPI States he has been doing well at work but is wife has noticed increased irritability. He wishes to go up on medication. Recent orthopedic consult for olecranon bursitis. States conservative treatment with wrapping did not work for him Planning to return for further rx.  Review of Systems     Objective:   Physical Exam Constitutional:      General: He is not in acute distress. Neurological:     Mental Status: He is alert.  Psychiatric:        Mood and Affect: Mood normal.        Behavior: Behavior normal.        Assessment:    1. Situational stress - sertraline (ZOLOFT) 100 MG tablet; Take 1 tablet (100 mg total) by mouth daily.  Dispense: 30 tablet; Refill: 3  2. Depression, major, single episode, moderate ([place]); increased dose of sertraline - sertraline (ZOLOFT) 100 MG tablet; Take 1 tablet (100 mg total) by mouth daily.  Dispense: 30 tablet; Refill: 3    Plan:    Return in 4 weeks for f/u and physical.

## 2018-07-18 ENCOUNTER — Ambulatory Visit (INDEPENDENT_AMBULATORY_CARE_PROVIDER_SITE_OTHER): Payer: Federal, State, Local not specified - PPO | Admitting: Family Medicine

## 2018-07-18 ENCOUNTER — Encounter: Payer: Self-pay | Admitting: Family Medicine

## 2018-07-18 VITALS — BP 120/82 | HR 94 | Temp 97.9°F | Resp 16 | Ht 67.5 in | Wt 195.8 lb

## 2018-07-18 DIAGNOSIS — Z0000 Encounter for general adult medical examination without abnormal findings: Secondary | ICD-10-CM | POA: Diagnosis not present

## 2018-07-18 DIAGNOSIS — Z1211 Encounter for screening for malignant neoplasm of colon: Secondary | ICD-10-CM

## 2018-07-18 DIAGNOSIS — Z125 Encounter for screening for malignant neoplasm of prostate: Secondary | ICD-10-CM

## 2018-07-18 DIAGNOSIS — Z131 Encounter for screening for diabetes mellitus: Secondary | ICD-10-CM

## 2018-07-18 DIAGNOSIS — Z13220 Encounter for screening for lipoid disorders: Secondary | ICD-10-CM

## 2018-07-18 DIAGNOSIS — F439 Reaction to severe stress, unspecified: Secondary | ICD-10-CM

## 2018-07-18 DIAGNOSIS — F321 Major depressive disorder, single episode, moderate: Secondary | ICD-10-CM

## 2018-07-18 NOTE — Patient Instructions (Signed)
We will call you with the lab results. The [facility] office will call you about the colonoscopy.

## 2018-07-18 NOTE — Progress Notes (Signed)
  Subjective:     Patient ID: Stephen Hubbard, male   DOB: [birth year], 53 y.o.   MRN: [id number] Chief Complaint  Patient presents with  . Annual Exam    Pt reports to office today for yearly physical. Pt reports feeling well and has no general complaints to address. Pt reports he is sleeping good with an average of 6 hours per night. Pt also reports that he has had a well balanced diet. Pt's last Tdap was 08/03/2016. Pt's last flu shot was 05/06/2018  . Depression    Pt reports to the office for a follow up of Antidepressent medication. Last visi 06/20/18 we increased medication. Pt reports good compliance and fair symptom control but states that there are some matters that he is still adjusting to and would like to discuss possibly increasing dosage in the near future.    HPI States he currently is satisfied with his dose of sertraline. He has had some mild itching but is now associated with blueberries and not with stress.  Review of Systems General: Feeling well HEENT: regular dental visits and pending eye exam Cardiovascular: no chest pain, shortness of breath, or palpitations GI: occasional heartburn, no change in bowel habits or blood in the stool GU: nocturia x 0, no change in bladder habits  Psychiatric: not depressed-in remission Musculoskeletal: no joint pain    Objective:   Physical Exam Constitutional:      General: He is not in acute distress.    Appearance: Normal appearance.  Neurological:     Mental Status: He is alert.  Psychiatric:        Mood and Affect: Mood normal.        Behavior: Behavior normal.   Eyes: PERRLA, EOMI Neck: no thyromegaly, tenderness or nodules, no cervical adenopathy or carotid bruits. ENT: TM's intact without inflammation; No tonsillar enlargement or exudate, Lungs: Clear Heart : RRR without murmur or gallop Abd: bowel sounds present, soft, non-tender, no organomegaly Rectal: Prostate firm and non-tender. Extremities: no edema Skin: no  atypical lesions noted on his trunk     Assessment:    1. Screening for colon cancer - Ambulatory referral to Gastroenterology  2. Annual physical exam  3. Screening for cholesterol level - Lipid panel  4. Screening for prostate cancer - PSA  5. Screening for diabetes mellitus - Comprehensive metabolic panel  6. Depression, major, single episode, moderate ([place]): continue sertraline at current dose  7. Situational stress: improved    Plan:    Further f/u pending lab results.

## 2018-07-22 DIAGNOSIS — Z125 Encounter for screening for malignant neoplasm of prostate: Secondary | ICD-10-CM | POA: Diagnosis not present

## 2018-07-22 DIAGNOSIS — Z13220 Encounter for screening for lipoid disorders: Secondary | ICD-10-CM | POA: Diagnosis not present

## 2018-07-22 DIAGNOSIS — Z131 Encounter for screening for diabetes mellitus: Secondary | ICD-10-CM | POA: Diagnosis not present

## 2018-07-23 ENCOUNTER — Telehealth: Payer: Self-pay

## 2018-07-23 LAB — COMPREHENSIVE METABOLIC PANEL
ALT: 22 IU/L (ref 0–44)
AST: 25 IU/L (ref 0–40)
Albumin/Globulin Ratio: 2 (ref 1.2–2.2)
Albumin: 4.3 g/dL (ref 3.8–4.9)
Alkaline Phosphatase: 79 IU/L (ref 39–117)
BUN/Creatinine Ratio: 15 (ref 9–20)
BUN: 13 mg/dL (ref 6–24)
Bilirubin Total: 0.9 mg/dL (ref 0.0–1.2)
CHLORIDE: 104 mmol/L (ref 96–106)
CO2: 23 mmol/L (ref 20–29)
Calcium: 8.7 mg/dL (ref 8.7–10.2)
Creatinine, Ser: 0.87 mg/dL (ref 0.76–1.27)
GFR calc Af Amer: 116 mL/min/{1.73_m2} (ref >59)
GFR calc non Af Amer: 100 mL/min/{1.73_m2} (ref >59)
Globulin, Total: 2.2 g/dL (ref 1.5–4.5)
Glucose: 87 mg/dL (ref 65–99)
Potassium: 4 mmol/L (ref 3.5–5.2)
Sodium: 140 mmol/L (ref 134–144)
Total Protein: 6.5 g/dL (ref 6.0–8.5)

## 2018-07-23 LAB — LIPID PANEL
CHOL/HDL RATIO: 3.7 ratio (ref 0.0–5.0)
Cholesterol, Total: 160 mg/dL (ref 100–199)
HDL: 43 mg/dL (ref >39)
LDL Calculated: 105 mg/dL — ABNORMAL HIGH (ref 0–99)
Triglycerides: 60 mg/dL (ref 0–149)
VLDL Cholesterol Cal: 12 mg/dL (ref 5–40)

## 2018-07-23 LAB — PSA: PROSTATE SPECIFIC AG, SERUM: 1.5 ng/mL (ref 0.0–4.0)

## 2018-07-23 NOTE — Telephone Encounter (Signed)
-----   Message from Carmon Ginsberg, [place] sent at 07/23/2018  7:26 AM EST ----- Labs look good. You have a very mild LDL cholesterol elevation. Your calculated risk for developing cardiovascular disease is very low at 2.9%. We usually recommend cholesterol lowering drugs at 7.5% risk.

## 2018-07-23 NOTE — Telephone Encounter (Signed)
Patient advised.KW 

## 2018-08-01 DIAGNOSIS — Z1211 Encounter for screening for malignant neoplasm of colon: Secondary | ICD-10-CM | POA: Diagnosis not present

## 2018-08-01 DIAGNOSIS — Z01818 Encounter for other preprocedural examination: Secondary | ICD-10-CM | POA: Diagnosis not present

## 2018-08-06 DIAGNOSIS — K640 First degree hemorrhoids: Secondary | ICD-10-CM | POA: Diagnosis not present

## 2018-08-06 DIAGNOSIS — K635 Polyp of colon: Secondary | ICD-10-CM | POA: Diagnosis not present

## 2018-08-06 DIAGNOSIS — K2950 Unspecified chronic gastritis without bleeding: Secondary | ICD-10-CM | POA: Diagnosis not present

## 2018-08-06 DIAGNOSIS — D126 Benign neoplasm of colon, unspecified: Secondary | ICD-10-CM | POA: Diagnosis not present

## 2018-08-06 DIAGNOSIS — K2970 Gastritis, unspecified, without bleeding: Secondary | ICD-10-CM | POA: Diagnosis not present

## 2018-08-06 DIAGNOSIS — D123 Benign neoplasm of transverse colon: Secondary | ICD-10-CM | POA: Diagnosis not present

## 2018-08-06 DIAGNOSIS — K210 Gastro-esophageal reflux disease with esophagitis: Secondary | ICD-10-CM | POA: Diagnosis not present

## 2018-08-06 DIAGNOSIS — D120 Benign neoplasm of cecum: Secondary | ICD-10-CM | POA: Diagnosis not present

## 2018-08-06 DIAGNOSIS — K3189 Other diseases of stomach and duodenum: Secondary | ICD-10-CM | POA: Diagnosis not present

## 2018-08-06 DIAGNOSIS — Z1211 Encounter for screening for malignant neoplasm of colon: Secondary | ICD-10-CM | POA: Diagnosis not present

## 2018-08-06 LAB — HM COLONOSCOPY

## 2018-11-07 ENCOUNTER — Other Ambulatory Visit: Payer: Self-pay | Admitting: Family Medicine

## 2018-11-07 DIAGNOSIS — F321 Major depressive disorder, single episode, moderate: Secondary | ICD-10-CM

## 2018-11-07 DIAGNOSIS — F439 Reaction to severe stress, unspecified: Secondary | ICD-10-CM

## 2018-11-07 MED ORDER — SERTRALINE HCL 100 MG PO TABS: 100.0000 mg | ORAL_TABLET | Freq: Every day | ORAL | 0 refills | Status: AC

## 2018-11-07 NOTE — Telephone Encounter (Signed)
[name]'s old patient. Tried to contact patient to schedule appointment with a new provider and now answer so left voicemail to return call. L.O.V. 07/18/2018, please advise.

## 2018-11-07 NOTE — Telephone Encounter (Signed)
1 refill sent. Please schedule OV with new provider within next month if returns call.

## 2018-11-07 NOTE — Telephone Encounter (Signed)
[facility] faxed refill request for the following medications: ° °sertraline (ZOLOFT) 100 MG tablet  ° °Please advise. ° °

## 2018-11-20 DIAGNOSIS — F4321 Adjustment disorder with depressed mood: Secondary | ICD-10-CM | POA: Diagnosis not present

## 2018-11-20 DIAGNOSIS — M1990 Unspecified osteoarthritis, unspecified site: Secondary | ICD-10-CM | POA: Diagnosis not present

## 2018-12-19 ENCOUNTER — Other Ambulatory Visit: Payer: Self-pay | Admitting: Family Medicine

## 2018-12-19 DIAGNOSIS — F321 Major depressive disorder, single episode, moderate: Secondary | ICD-10-CM

## 2018-12-19 DIAGNOSIS — F439 Reaction to severe stress, unspecified: Secondary | ICD-10-CM

## 2018-12-19 NOTE — Telephone Encounter (Signed)
Last month, said would fill 1 month supply and that he needed f/u appt.  He still needs to reestablish with new provider in our clinic.  Please try t oreach out

## 2018-12-20 NOTE — Telephone Encounter (Signed)
Have we been able to reach him to schedule him with a new provider in our clinic?

## 2018-12-24 NOTE — Telephone Encounter (Signed)
[facility] to schedule appointment with another provider.

## 2019-01-13 NOTE — Telephone Encounter (Signed)
Patient states that he see another provider at a different office due to no be able to get the provider he requested.

## 2019-03-07 DIAGNOSIS — Z13220 Encounter for screening for lipoid disorders: Secondary | ICD-10-CM | POA: Diagnosis not present

## 2019-03-07 DIAGNOSIS — Z1389 Encounter for screening for other disorder: Secondary | ICD-10-CM | POA: Diagnosis not present

## 2019-03-07 DIAGNOSIS — Z0000 Encounter for general adult medical examination without abnormal findings: Secondary | ICD-10-CM | POA: Diagnosis not present

## 2019-03-20 DIAGNOSIS — Z125 Encounter for screening for malignant neoplasm of prostate: Secondary | ICD-10-CM | POA: Diagnosis not present

## 2019-03-20 DIAGNOSIS — R030 Elevated blood-pressure reading, without diagnosis of hypertension: Secondary | ICD-10-CM | POA: Diagnosis not present

## 2019-03-20 DIAGNOSIS — R1013 Epigastric pain: Secondary | ICD-10-CM | POA: Diagnosis not present

## 2019-03-20 DIAGNOSIS — F321 Major depressive disorder, single episode, moderate: Secondary | ICD-10-CM | POA: Diagnosis not present

## 2019-04-28 DIAGNOSIS — L578 Other skin changes due to chronic exposure to nonionizing radiation: Secondary | ICD-10-CM | POA: Diagnosis not present

## 2019-04-28 DIAGNOSIS — C4491 Basal cell carcinoma of skin, unspecified: Secondary | ICD-10-CM

## 2019-04-28 DIAGNOSIS — C44319 Basal cell carcinoma of skin of other parts of face: Secondary | ICD-10-CM | POA: Diagnosis not present

## 2019-04-28 DIAGNOSIS — L821 Other seborrheic keratosis: Secondary | ICD-10-CM | POA: Diagnosis not present

## 2019-04-28 DIAGNOSIS — L820 Inflamed seborrheic keratosis: Secondary | ICD-10-CM | POA: Diagnosis not present

## 2019-04-28 DIAGNOSIS — D485 Neoplasm of uncertain behavior of skin: Secondary | ICD-10-CM | POA: Diagnosis not present

## 2019-04-28 DIAGNOSIS — L570 Actinic keratosis: Secondary | ICD-10-CM | POA: Diagnosis not present

## 2019-04-28 HISTORY — DX: Basal cell carcinoma of skin, unspecified: C44.91

## 2019-05-09 DIAGNOSIS — F418 Other specified anxiety disorders: Secondary | ICD-10-CM | POA: Diagnosis not present

## 2019-06-30 DIAGNOSIS — L570 Actinic keratosis: Secondary | ICD-10-CM | POA: Diagnosis not present

## 2019-06-30 DIAGNOSIS — C44319 Basal cell carcinoma of skin of other parts of face: Secondary | ICD-10-CM | POA: Diagnosis not present

## 2019-06-30 DIAGNOSIS — L578 Other skin changes due to chronic exposure to nonionizing radiation: Secondary | ICD-10-CM | POA: Diagnosis not present

## 2019-07-01 ENCOUNTER — Other Ambulatory Visit: Payer: Self-pay | Admitting: Student

## 2019-07-01 DIAGNOSIS — M25521 Pain in right elbow: Secondary | ICD-10-CM | POA: Diagnosis not present

## 2019-07-01 DIAGNOSIS — S46211A Strain of muscle, fascia and tendon of other parts of biceps, right arm, initial encounter: Secondary | ICD-10-CM | POA: Diagnosis not present

## 2019-07-01 DIAGNOSIS — M7521 Bicipital tendinitis, right shoulder: Secondary | ICD-10-CM | POA: Diagnosis not present

## 2019-07-07 ENCOUNTER — Ambulatory Visit
Admission: RE | Admit: 2019-07-07 | Discharge: 2019-07-07 | Disposition: A | Discharge status: Home / Self Care | Payer: Federal, State, Local not specified - PPO | Source: Ambulatory Visit | Attending: Student | Admitting: Student

## 2019-07-07 ENCOUNTER — Other Ambulatory Visit: Payer: Self-pay

## 2019-07-07 DIAGNOSIS — S46211A Strain of muscle, fascia and tendon of other parts of biceps, right arm, initial encounter: Secondary | ICD-10-CM

## 2019-07-07 DIAGNOSIS — M7521 Bicipital tendinitis, right shoulder: Secondary | ICD-10-CM | POA: Insufficient documentation

## 2019-08-06 DIAGNOSIS — M7521 Bicipital tendinitis, right shoulder: Secondary | ICD-10-CM | POA: Diagnosis not present

## 2019-08-13 DIAGNOSIS — C44319 Basal cell carcinoma of skin of other parts of face: Secondary | ICD-10-CM | POA: Diagnosis not present

## 2019-08-13 DIAGNOSIS — Z85828 Personal history of other malignant neoplasm of skin: Secondary | ICD-10-CM | POA: Insufficient documentation

## 2019-09-17 DIAGNOSIS — M7521 Bicipital tendinitis, right shoulder: Secondary | ICD-10-CM | POA: Diagnosis not present

## 2019-11-13 DIAGNOSIS — R030 Elevated blood-pressure reading, without diagnosis of hypertension: Secondary | ICD-10-CM | POA: Diagnosis not present

## 2019-11-13 DIAGNOSIS — Z125 Encounter for screening for malignant neoplasm of prostate: Secondary | ICD-10-CM | POA: Diagnosis not present

## 2019-11-13 DIAGNOSIS — R1013 Epigastric pain: Secondary | ICD-10-CM | POA: Diagnosis not present

## 2019-11-20 DIAGNOSIS — K3189 Other diseases of stomach and duodenum: Secondary | ICD-10-CM | POA: Diagnosis not present

## 2019-11-20 DIAGNOSIS — Z125 Encounter for screening for malignant neoplasm of prostate: Secondary | ICD-10-CM | POA: Diagnosis not present

## 2019-11-20 DIAGNOSIS — Z0000 Encounter for general adult medical examination without abnormal findings: Secondary | ICD-10-CM | POA: Diagnosis not present

## 2019-12-29 ENCOUNTER — Other Ambulatory Visit: Payer: Self-pay

## 2019-12-29 ENCOUNTER — Ambulatory Visit: Payer: Federal, State, Local not specified - PPO | Admitting: Dermatology

## 2019-12-29 DIAGNOSIS — L578 Other skin changes due to chronic exposure to nonionizing radiation: Secondary | ICD-10-CM

## 2019-12-29 DIAGNOSIS — L570 Actinic keratosis: Secondary | ICD-10-CM | POA: Diagnosis not present

## 2019-12-29 DIAGNOSIS — Z85828 Personal history of other malignant neoplasm of skin: Secondary | ICD-10-CM

## 2019-12-29 NOTE — Progress Notes (Signed)
   Follow-Up Visit   Subjective  Stephen Hubbard is a 54 y.o. male who presents for the following: Basal Cell Carcinoma (of right cheek - treated with [facility] 08/2019) and Follow-up (AK follow up of scalp - LN2 x 6 06/2019).  The following portions of the chart were reviewed this encounter and updated as appropriate:  Tobacco  Allergies  Meds  Problems  Med Hx  Surg Hx  Fam Hx     Review of Systems:  No other skin or systemic complaints except as noted in HPI or Assessment and Plan.  Objective  Well appearing patient in no apparent distress; mood and affect are within normal limits.  A focused examination was performed including face, scalp. Relevant physical exam findings are noted in the Assessment and Plan.  Objective  Scalp: Erythematous thin papules/macules with gritty scale.    Assessment & Plan    AK (actinic keratosis) Scalp  Fluorouracil 5%/Calcipotriene 0.05% cream bid x 7 days - sent to [facility]. Advised patient he can delay treatment until September if he prefers.  Destruction of lesion - Scalp Complexity: simple   Destruction method: cryotherapy   Informed consent: discussed and consent obtained   Timeout:  patient name, date of birth, surgical site, and procedure verified Lesion destroyed using liquid nitrogen: Yes   Region frozen until ice ball extended beyond lesion: Yes   Outcome: patient tolerated procedure well with no complications   Post-procedure details: wound care instructions given    Actinic Damage - diffuse scaly erythematous macules with underlying dyspigmentation - Recommend daily broad spectrum sunscreen SPF 30+ to sun-exposed areas, reapply every 2 hours as needed.  - Call for new or changing lesions.  History of Basal Cell Carcinoma of the Skin - R cheek - S/P Mohs - No evidence of recurrence today - Recommend regular full body skin exams - Recommend daily broad spectrum sunscreen SPF 30+ to sun-exposed areas, reapply every 2  hours as needed.  - Call if any new or changing lesions are noted between office visits  Return in about 6 months (around 06/30/2020).   I, Ashok Cordia, CMA, am acting as scribe for Sarina Ser, MD .  Documentation: I have reviewed the above documentation for accuracy and completeness, and I agree with the above.  Sarina Ser, MD

## 2019-12-29 NOTE — Patient Instructions (Signed)
Wound Care Instructions  1. Cleanse wound gently with soap and water once a day then pat dry with clean gauze. Apply a thing coat of Petrolatum (petroleum jelly, "Vaseline") over the wound (unless you have an allergy to this). We recommend that you use a new, sterile tube of Vaseline. Do not pick or remove scabs. Do not remove the yellow or white "healing tissue" from the base of the wound.  2. Cover the wound with fresh, clean, nonstick gauze and secure with paper tape. You may use Band-Aids in place of gauze and tape if the would is small enough, but would recommend trimming much of the tape off as there is often too much. Sometimes Band-Aids can irritate the skin.  3. You should call the office for your biopsy report after 1 week if you have not already been contacted.  4. If you experience any problems, such as abnormal amounts of bleeding, swelling, significant bruising, significant pain, or evidence of infection, please call the office immediately.  5. FOR ADULT SURGERY PATIENTS: If you need something for pain relief you may take 1 extra strength Tylenol (acetaminophen) AND 2 Ibuprofen (200mg  each) together every 4 hours as needed for pain. (do not take these if you are allergic to them or if you have a reason you should not take them.) Typically, you may only need pain medication for 1 to 3 days.    Instructions for [facility] Medications  One or more of your medications was sent to the [facility] mail order compounding pharmacy. You will receive an email from them and can purchase the medicine through that link. It will then be mailed to your home at the address you confirmed. If for any reason you do not receive an email from them, please check your spam folder. If you still do not find the email, please let us know.

## 2019-12-30 ENCOUNTER — Encounter: Payer: Self-pay | Admitting: Dermatology

## 2020-01-14 DIAGNOSIS — Z20822 Contact with and (suspected) exposure to covid-19: Secondary | ICD-10-CM | POA: Diagnosis not present

## 2020-03-30 DIAGNOSIS — C44319 Basal cell carcinoma of skin of other parts of face: Secondary | ICD-10-CM | POA: Diagnosis not present

## 2020-07-07 ENCOUNTER — Ambulatory Visit: Payer: Federal, State, Local not specified - PPO | Admitting: Dermatology

## 2020-08-09 ENCOUNTER — Ambulatory Visit: Payer: Federal, State, Local not specified - PPO | Admitting: Dermatology

## 2020-11-06 IMAGING — MR MR ELBOW*R* W/O CM
7 series · 40 of 40 positions shown · non-contrast
Comparison: None.

CLINICAL DATA: Right elbow pain radiating to the shoulder

EXAM:
MRI OF THE RIGHT ELBOW WITHOUT CONTRAST
TECHNIQUE: Multiplanar, multisequence MR imaging of the elbow was performed. No
intravenous contrast was administered.

[Series 3: T1 · axial · right · 3.0mm · 0.47mm/px · z∈[+16,+110]mm · 5 of 25 slices shown (1 of 2)]
[im 1/25]
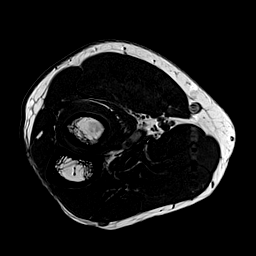
[im 7/25]
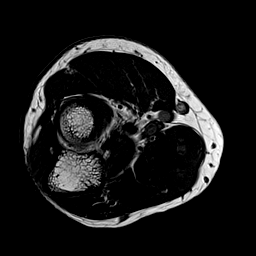
[im 13/25]
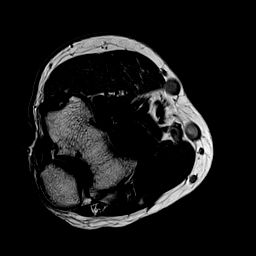
[im 19/25]
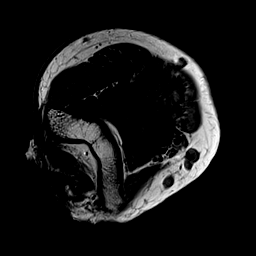
[im 25/25]
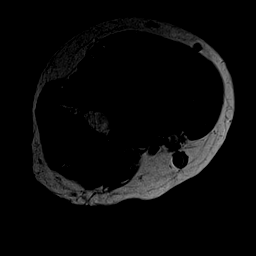

[Series 4: T2 fat-sat · axial · right · 3.0mm · 0.47mm/px · z∈[+16,+110]mm · 5 of 25 slices shown (1 of 2)]
[im 1/25]
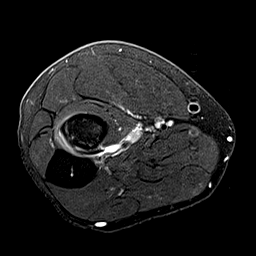
[im 7/25]
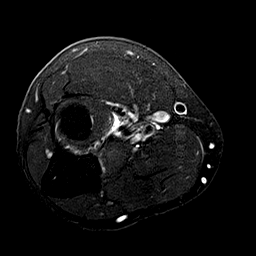
[im 13/25]
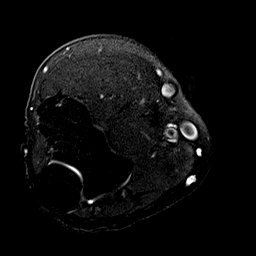
[im 19/25]
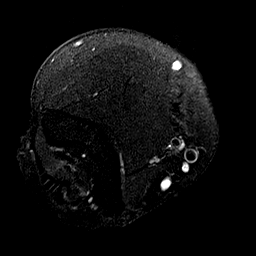
[im 25/25]
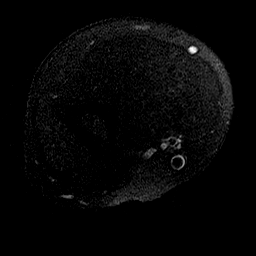

[Series 5: T1 · oblique · right · 3.0mm · 0.55mm/px · 6 of 25 slices shown (2 of 2)]
[im 1/25]
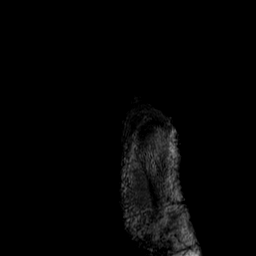
[im 5/25]
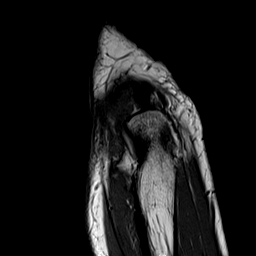
[im 10/25]
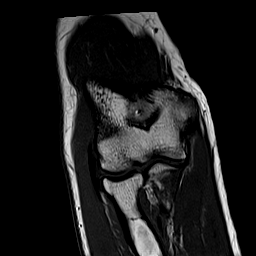
[im 15/25]
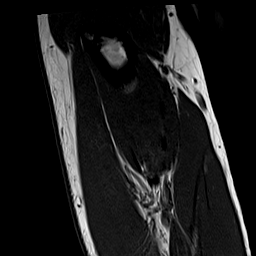
[im 20/25]
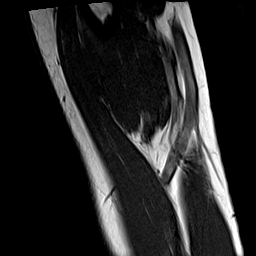
[im 25/25]
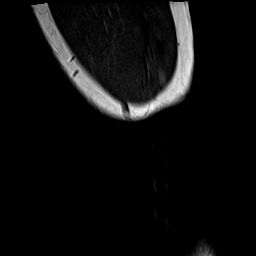

[Series 6: T2 fat-sat · oblique · right · 3.0mm · 0.55mm/px · 6 of 25 slices shown (2 of 2)]
[im 1/25]
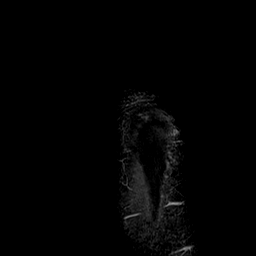
[im 5/25]
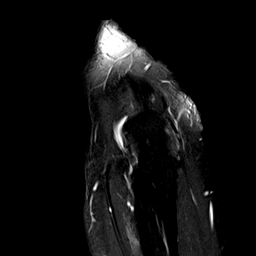
[im 10/25]
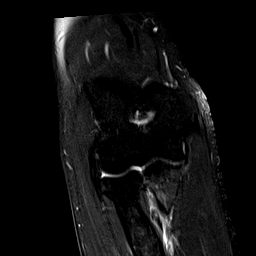
[im 15/25]
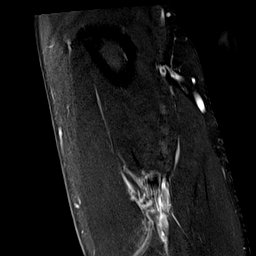
[im 20/25]
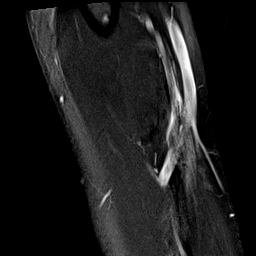
[im 25/25]
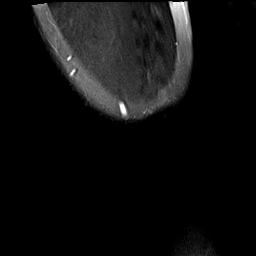

[Series 7: PD fat-sat · coronal · right · 3.0mm · 0.55mm/px · 6 of 29 slices shown]
[im 1/29]
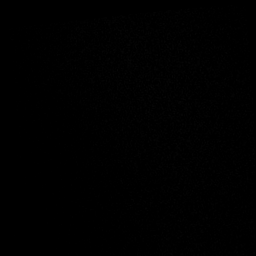
[im 6/29]
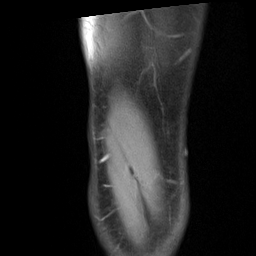
[im 12/29]
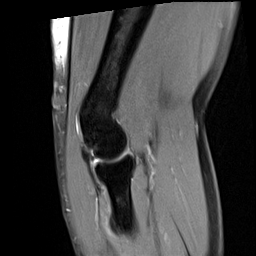
[im 17/29]
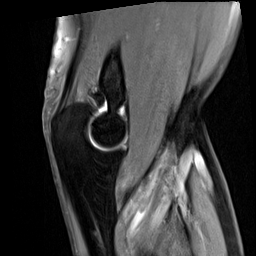
[im 23/29]
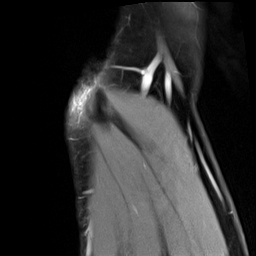
[im 29/29]
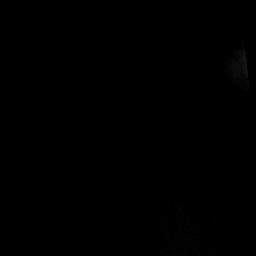

[Series 1014: (person_name)1 long axis_new · sagittal · right · 3.0mm · 0.55mm/px · 6 of 25 slices shown]
[im 1/25]
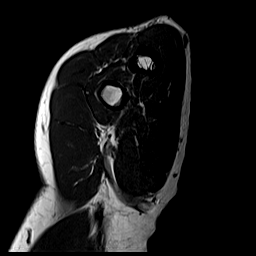
[im 5/25]
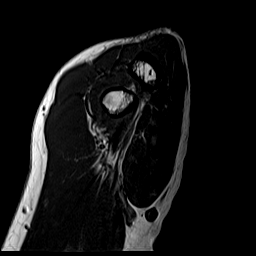
[im 10/25]
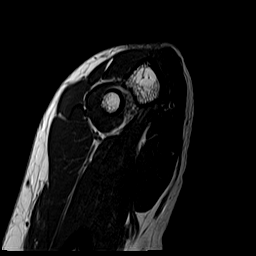
[im 15/25]
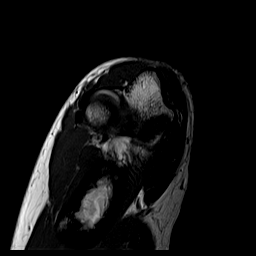
[im 20/25]
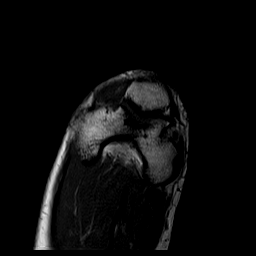
[im 25/25]
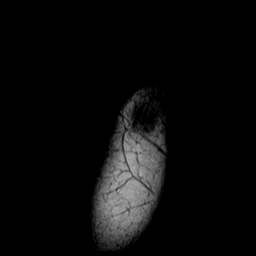

[Series 1022: (person_name) axis_new · sagittal · right · 3.0mm · 0.55mm/px · 6 of 25 slices shown]
[im 1/25]
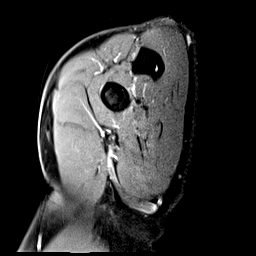
[im 5/25]
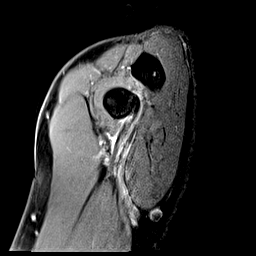
[im 10/25]
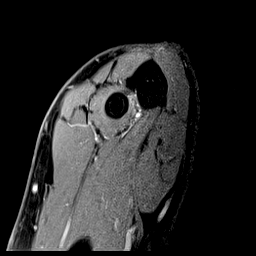
[im 15/25]
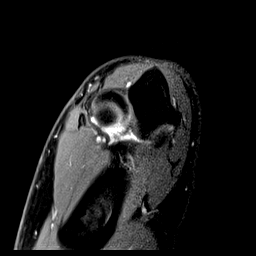
[im 20/25]
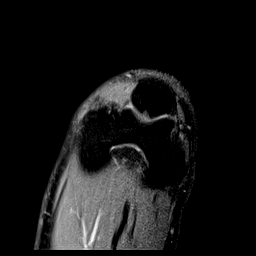
[im 25/25]
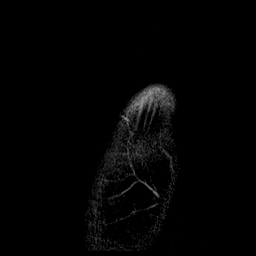

[40 of 40 positions shown; findings below may reference images not displayed]

FINDINGS: TENDONS

Common forearm flexor origin: Intact.

Common forearm extensor origin: Intact.

Biceps: Low-grade partial tearing of the distal biceps tendon with
surrounding peritendinous fluid and edema (series 6, images 16-17;
series 4, images 21-25). The majority of the tendon fibers are
intact inserting on the radial tuberosity. No full-thickness or
retracted component

Triceps: Intact.

LIGAMENTS

Medial stabilizers: Intact.

Lateral stabilizers:  Intact.

Cartilage: No chondral defect.

Joint: Normal alignment. No joint effusion.

Cubital tunnel: Normal.

Bones: No fracture. No bone marrow signal abnormality.
IMPRESSION: Low-grade partial tearing of the distal biceps tendon with
surrounding peritendinous fluid and edema. The majority of the
tendon fibers are intact inserting on the radial tuberosity. No
full-thickness or retracted component.

## 2022-12-05 DIAGNOSIS — R1013 Epigastric pain: Secondary | ICD-10-CM | POA: Insufficient documentation

## 2022-12-15 ENCOUNTER — Ambulatory Visit: Payer: Federal, State, Local not specified - PPO

## 2022-12-15 DIAGNOSIS — K2100 Gastro-esophageal reflux disease with esophagitis, without bleeding: Secondary | ICD-10-CM

## 2022-12-15 DIAGNOSIS — K222 Esophageal obstruction: Secondary | ICD-10-CM

## 2024-06-16 ENCOUNTER — Ambulatory Visit: Admitting: Dermatology

## 2024-06-16 ENCOUNTER — Encounter: Payer: Self-pay | Admitting: Dermatology

## 2024-06-16 DIAGNOSIS — L578 Other skin changes due to chronic exposure to nonionizing radiation: Secondary | ICD-10-CM | POA: Diagnosis not present

## 2024-06-16 DIAGNOSIS — D1801 Hemangioma of skin and subcutaneous tissue: Secondary | ICD-10-CM

## 2024-06-16 DIAGNOSIS — L570 Actinic keratosis: Secondary | ICD-10-CM

## 2024-06-16 DIAGNOSIS — Z85828 Personal history of other malignant neoplasm of skin: Secondary | ICD-10-CM

## 2024-06-16 DIAGNOSIS — D229 Melanocytic nevi, unspecified: Secondary | ICD-10-CM

## 2024-06-16 DIAGNOSIS — Z1283 Encounter for screening for malignant neoplasm of skin: Secondary | ICD-10-CM

## 2024-06-16 DIAGNOSIS — W908XXA Exposure to other nonionizing radiation, initial encounter: Secondary | ICD-10-CM | POA: Diagnosis not present

## 2024-06-16 DIAGNOSIS — L821 Other seborrheic keratosis: Secondary | ICD-10-CM

## 2024-06-16 DIAGNOSIS — L814 Other melanin hyperpigmentation: Secondary | ICD-10-CM | POA: Diagnosis not present

## 2024-06-16 DIAGNOSIS — L719 Rosacea, unspecified: Secondary | ICD-10-CM | POA: Diagnosis not present

## 2024-06-16 NOTE — Patient Instructions (Addendum)

## 2024-06-16 NOTE — Progress Notes (Signed)
 "  New Patient Visit   Subjective  Stephen Hubbard is a 59 y.o. male who presents for the following: Skin Cancer Screening and Full Body Skin Exam  The patient presents for Total-Body Skin Exam (TBSE) for skin cancer screening and mole check. The patient has spots, moles and lesions to be evaluated, some may be new or changing and the patient may have concern these could be cancer.  Hx BCC. Patient with a few spots at face that he can scratch off and then they come back. Patient was seen 12/2019 and was prescribed topical 5FU/calcipotriene but never used it.   The following portions of the chart were reviewed this encounter and updated as appropriate: medications, allergies, medical history  Review of Systems:  No other skin or systemic complaints except as noted in HPI or Assessment and Plan.  Objective  Well appearing patient in no apparent distress; mood and affect are within normal limits.  A full examination was performed including scalp, head, eyes, ears, nose, lips, neck, chest, axillae, abdomen, back, buttocks, bilateral upper extremities, bilateral lower extremities, hands, feet, fingers, toes, fingernails, and toenails. All findings within normal limits unless otherwise noted below.   Relevant physical exam findings are noted in the Assessment and Plan.  Scalp x 9 (9) Erythematous thin papules/macules with gritty scale.   Assessment & Plan   SKIN CANCER SCREENING PERFORMED TODAY.  ACTINIC DAMAGE - chronic, secondary to cumulative UV radiation exposure/sun exposure over time - diffuse scaly erythematous macules with underlying dyspigmentation - Recommend daily broad spectrum sunscreen SPF 30+ to sun-exposed areas, reapply every 2 hours as needed.  - Recommend staying in the shade or wearing long sleeves, sun glasses (UVA+UVB protection) and wide brim hats (4-inch brim around the entire circumference of the hat). - Call for new or changing lesions.  Reviewed course of  treatment and expected reaction.  Patient advised to expect inflammation and crusting and advised that erosions are possible.  Patient advised to be diligent with sun protection during and after treatment. Counseled to keep medication out of reach of children and pets.   LENTIGINES, SEBORRHEIC KERATOSES, HEMANGIOMAS - Benign normal skin lesions - Benign-appearing - Call for any changes  MELANOCYTIC NEVI - Tan-brown and/or pink-flesh-colored symmetric macules and papules - Benign appearing on exam today - Observation - Call clinic for new or changing moles - Recommend daily use of broad spectrum spf 30+ sunscreen to sun-exposed areas.   HISTORY OF BASAL CELL CARCINOMA OF THE SKIN - right cheek, Mohs - No evidence of recurrence today - Recommend regular full body skin exams - Recommend daily broad spectrum sunscreen SPF 30+ to sun-exposed areas, reapply every 2 hours as needed.  - Call if any new or changing lesions are noted between office visits  ROSACEA Exam Mid face erythema with telangiectasias + scattered inflammatory papules  Chronic and persistent condition with duration or expected duration over one year. Condition is symptomatic / bothersome to patient. Not to goal.  Rosacea is a chronic progressive skin condition usually affecting the face of adults, causing redness and/or acne bumps. It is treatable but not curable. It sometimes affects the eyes (ocular rosacea) as well. It may respond to topical and/or systemic medication and can flare with stress, sun exposure, alcohol, exercise, topical steroids (including hydrocortisone/cortisone 10) and some foods.  Daily application of broad spectrum spf 30+ sunscreen to face is recommended to reduce flares.  Patient denies grittiness of the eyes  Treatment Plan Sun protection daily AK (  ACTINIC KERATOSIS) (9) Scalp x 9 (9) Actinic keratoses are precancerous spots that appear secondary to cumulative UV radiation exposure/sun exposure  over time. They are chronic with expected duration over 1 year. A portion of actinic keratoses will progress to squamous cell carcinoma of the skin. It is not possible to reliably predict which spots will progress to skin cancer and so treatment is recommended to prevent development of skin cancer.  Recommend daily broad spectrum sunscreen SPF 30+ to sun-exposed areas, reapply every 2 hours as needed.  Recommend staying in the shade or wearing long sleeves, sun glasses (UVA+UVB protection) and wide brim hats (4-inch brim around the entire circumference of the hat). Call for new or changing lesions. - Destruction of lesion - Scalp x 9 (9) Complexity: simple   Destruction method: cryotherapy   Informed consent: discussed and consent obtained   Timeout:  patient name, date of birth, surgical site, and procedure verified Lesion destroyed using liquid nitrogen: Yes   Region frozen until ice ball extended beyond lesion: Yes   Cryo cycles: 1 or 2. Outcome: patient tolerated procedure well with no complications   Post-procedure details: wound care instructions given    LENTIGINES   MULTIPLE BENIGN NEVI   SEBORRHEIC KERATOSES   CHERRY ANGIOMA   ACTINIC ELASTOSIS    Return in about 1 year (around 06/16/2025) for TBSE, with Dr. Claudene, HxBCC, HxAK.  LILLETTE Lonell Drones, RMA, am acting as scribe for Boneta Claudene, MD .   Documentation: I have reviewed the above documentation for accuracy and completeness, and I agree with the above.  Boneta Claudene, MD    "

## 2024-08-18 ENCOUNTER — Encounter

## 2025-06-16 ENCOUNTER — Ambulatory Visit: Admitting: Dermatology
